# Patient Record
Sex: Female | Born: 2000 | Hispanic: Yes | Marital: Single | Smoking: Current every day smoker
Health system: Southern US, Community
[De-identification: ages and names within clinical notes are randomized; demographics above are authoritative.]

---
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
## 2023-09-17 ENCOUNTER — Other Ambulatory Visit: Payer: Self-pay

## 2023-09-17 DIAGNOSIS — K353 Acute appendicitis with localized peritonitis, without perforation or gangrene: Secondary | ICD-10-CM | POA: Diagnosis not present

## 2023-09-17 DIAGNOSIS — R112 Nausea with vomiting, unspecified: Secondary | ICD-10-CM | POA: Insufficient documentation

## 2023-09-17 DIAGNOSIS — R1031 Right lower quadrant pain: Secondary | ICD-10-CM | POA: Diagnosis present

## 2023-09-17 LAB — CBC
HCT: 39 % (ref 36.0–46.0)
Hemoglobin: 13.1 g/dL (ref 12.0–15.0)
MCH: 29 pg (ref 26.0–34.0)
MCHC: 33.6 g/dL (ref 30.0–36.0)
MCV: 86.5 fL (ref 80.0–100.0)
Platelets: 234 K/uL (ref 150–400)
RBC: 4.51 MIL/uL (ref 3.87–5.11)
RDW: 13 % (ref 11.5–15.5)
WBC: 16.4 K/uL — ABNORMAL HIGH (ref 4.0–10.5)
nRBC: 0 % (ref 0.0–0.2)

## 2023-09-17 LAB — POC URINE PREG, ED: Preg Test, Ur: NEGATIVE

## 2023-09-17 MED ORDER — ONDANSETRON 4 MG PO TBDP
ORAL_TABLET | ORAL | Status: AC
  Filled 2023-09-17: qty 1

## 2023-09-17 MED ORDER — ONDANSETRON 4 MG PO TBDP
4.0000 mg | ORAL_TABLET | Freq: Once | ORAL | Status: AC | PRN
  Administered 2023-09-17: 4 mg via ORAL

## 2023-09-17 NOTE — ED Triage Notes (Signed)
 Pt presented to ED with c/o abd pain and vomiting x 4 hours. States 5 episodes of vomiting. States started period last night and though pain was due to cramps. States took Tylenol  PTA without relief of symptoms. Denies urinary symptoms.

## 2023-09-18 ENCOUNTER — Observation Stay: Payer: Self-pay | Admitting: Anesthesiology

## 2023-09-18 ENCOUNTER — Emergency Department: Payer: Self-pay

## 2023-09-18 ENCOUNTER — Other Ambulatory Visit: Payer: Self-pay

## 2023-09-18 ENCOUNTER — Encounter
Admission: EM | Disposition: A | Discharge status: Home / Self Care | Payer: Self-pay | Source: Home / Self Care | Attending: Emergency Medicine

## 2023-09-18 ENCOUNTER — Observation Stay
Admission: EM | Admit: 2023-09-18 | Discharge: 2023-09-19 | Disposition: A | Discharge status: Home / Self Care | Payer: Self-pay | Attending: Surgery | Admitting: Surgery

## 2023-09-18 DIAGNOSIS — R112 Nausea with vomiting, unspecified: Secondary | ICD-10-CM

## 2023-09-18 DIAGNOSIS — R1033 Periumbilical pain: Principal | ICD-10-CM

## 2023-09-18 DIAGNOSIS — K3580 Unspecified acute appendicitis: Secondary | ICD-10-CM | POA: Diagnosis present

## 2023-09-18 DIAGNOSIS — K353 Acute appendicitis with localized peritonitis, without perforation or gangrene: Secondary | ICD-10-CM

## 2023-09-18 HISTORY — PX: XI ROBOTIC LAPAROSCOPIC ASSISTED APPENDECTOMY: SHX6877

## 2023-09-18 LAB — CBC
HCT: 35.1 % — ABNORMAL LOW (ref 36.0–46.0)
Hemoglobin: 12.1 g/dL (ref 12.0–15.0)
MCH: 29.5 pg (ref 26.0–34.0)
MCHC: 34.5 g/dL (ref 30.0–36.0)
MCV: 85.6 fL (ref 80.0–100.0)
Platelets: 199 K/uL (ref 150–400)
RBC: 4.1 MIL/uL (ref 3.87–5.11)
RDW: 13.1 % (ref 11.5–15.5)
WBC: 15 K/uL — ABNORMAL HIGH (ref 4.0–10.5)
nRBC: 0 % (ref 0.0–0.2)

## 2023-09-18 LAB — COMPREHENSIVE METABOLIC PANEL WITH GFR
ALT: 19 U/L (ref 0–44)
AST: 24 U/L (ref 15–41)
Albumin: 4.2 g/dL (ref 3.5–5.0)
Alkaline Phosphatase: 66 U/L (ref 38–126)
Anion gap: 10 (ref 5–15)
BUN: 13 mg/dL (ref 6–20)
CO2: 23 mmol/L (ref 22–32)
Calcium: 9.1 mg/dL (ref 8.9–10.3)
Chloride: 106 mmol/L (ref 98–111)
Creatinine, Ser: 0.74 mg/dL (ref 0.44–1.00)
GFR, Estimated: >60 mL/min (ref >60)
Glucose, Bld: 131 mg/dL — ABNORMAL HIGH (ref 70–99)
Potassium: 3.1 mmol/L — ABNORMAL LOW (ref 3.5–5.1)
Sodium: 139 mmol/L (ref 135–145)
Total Bilirubin: 0.8 mg/dL (ref 0.0–1.2)
Total Protein: 7.4 g/dL (ref 6.5–8.1)

## 2023-09-18 LAB — URINALYSIS, ROUTINE W REFLEX MICROSCOPIC
Bilirubin Urine: NEGATIVE
Glucose, UA: NEGATIVE mg/dL
Ketones, ur: NEGATIVE mg/dL
Leukocytes,Ua: NEGATIVE
Nitrite: NEGATIVE
Protein, ur: 30 mg/dL — AB
RBC / HPF: >50 RBC/hpf (ref 0–5)
Specific Gravity, Urine: 1.021 (ref 1.005–1.030)
pH: 5 (ref 5.0–8.0)

## 2023-09-18 LAB — LIPASE, BLOOD: Lipase: 36 U/L (ref 11–51)

## 2023-09-18 LAB — BASIC METABOLIC PANEL WITH GFR
Anion gap: 8 (ref 5–15)
BUN: 10 mg/dL (ref 6–20)
CO2: 24 mmol/L (ref 22–32)
Calcium: 8.5 mg/dL — ABNORMAL LOW (ref 8.9–10.3)
Chloride: 106 mmol/L (ref 98–111)
Creatinine, Ser: 0.66 mg/dL (ref 0.44–1.00)
GFR, Estimated: >60 mL/min (ref >60)
Glucose, Bld: 105 mg/dL — ABNORMAL HIGH (ref 70–99)
Potassium: 3.6 mmol/L (ref 3.5–5.1)
Sodium: 138 mmol/L (ref 135–145)

## 2023-09-18 LAB — HIV ANTIBODY (ROUTINE TESTING W REFLEX): HIV Screen 4th Generation wRfx: NONREACTIVE

## 2023-09-18 SURGERY — APPENDECTOMY, ROBOT-ASSISTED, LAPAROSCOPIC
Anesthesia: General

## 2023-09-18 MED ORDER — LIDOCAINE-EPINEPHRINE (PF) 1 %-1:200000 IJ SOLN
INTRAMUSCULAR | Status: AC
  Filled 2023-09-18: qty 30

## 2023-09-18 MED ORDER — SUGAMMADEX SODIUM 200 MG/2ML IV SOLN
INTRAVENOUS | Status: DC | PRN
  Administered 2023-09-18: 200 mg via INTRAVENOUS

## 2023-09-18 MED ORDER — ACETAMINOPHEN 325 MG PO TABS: 650.0000 mg | ORAL_TABLET | Freq: Three times a day (TID) | ORAL | Status: DC | PRN

## 2023-09-18 MED ORDER — LIDOCAINE HCL (PF) 2 % IJ SOLN
INTRAMUSCULAR | Status: AC
  Filled 2023-09-18: qty 5

## 2023-09-18 MED ORDER — LACTATED RINGERS IV SOLN: INTRAVENOUS | Status: DC

## 2023-09-18 MED ORDER — DOCUSATE SODIUM 100 MG PO CAPS: 100.0000 mg | ORAL_CAPSULE | Freq: Two times a day (BID) | ORAL | 0 refills | Status: AC | PRN

## 2023-09-18 MED ORDER — LIDOCAINE-EPINEPHRINE (PF) 1 %-1:200000 IJ SOLN
INTRAMUSCULAR | Status: DC | PRN
  Administered 2023-09-18: 30 mL

## 2023-09-18 MED ORDER — BUPIVACAINE HCL (PF) 0.5 % IJ SOLN
INTRAMUSCULAR | Status: AC
  Filled 2023-09-18: qty 30

## 2023-09-18 MED ORDER — MORPHINE SULFATE (PF) 2 MG/ML IV SOLN
2.0000 mg | INTRAVENOUS | Status: DC | PRN
  Administered 2023-09-18 – 2023-09-19 (×4): 2 mg via INTRAVENOUS
  Filled 2023-09-18 (×4): qty 1

## 2023-09-18 MED ORDER — PROPOFOL 10 MG/ML IV BOLUS
INTRAVENOUS | Status: AC
  Filled 2023-09-18: qty 20

## 2023-09-18 MED ORDER — LACTATED RINGERS IV BOLUS
500.0000 mL | Freq: Once | INTRAVENOUS | Status: AC
  Administered 2023-09-18: 500 mL via INTRAVENOUS

## 2023-09-18 MED ORDER — FENTANYL CITRATE (PF) 100 MCG/2ML IJ SOLN: 25.0000 ug | INTRAMUSCULAR | Status: DC | PRN

## 2023-09-18 MED ORDER — IBUPROFEN 800 MG PO TABS: 800.0000 mg | ORAL_TABLET | Freq: Three times a day (TID) | ORAL | 0 refills | Status: AC | PRN

## 2023-09-18 MED ORDER — DEXAMETHASONE SODIUM PHOSPHATE 10 MG/ML IJ SOLN
INTRAMUSCULAR | Status: DC | PRN
Start: 2023-09-18 — End: 2023-09-18
  Administered 2023-09-18: 10 mg via INTRAVENOUS

## 2023-09-18 MED ORDER — LACTATED RINGERS IV SOLN: INTRAVENOUS | Status: DC | PRN

## 2023-09-18 MED ORDER — MORPHINE SULFATE (PF) 2 MG/ML IV SOLN
2.0000 mg | Freq: Once | INTRAVENOUS | Status: AC
  Administered 2023-09-18: 2 mg via INTRAVENOUS
  Filled 2023-09-18: qty 1

## 2023-09-18 MED ORDER — LIDOCAINE HCL (CARDIAC) PF 100 MG/5ML IV SOSY
PREFILLED_SYRINGE | INTRAVENOUS | Status: DC | PRN
  Administered 2023-09-18: 60 mg via INTRAVENOUS

## 2023-09-18 MED ORDER — ACETAMINOPHEN 325 MG PO TABS: 650.0000 mg | ORAL_TABLET | Freq: Three times a day (TID) | ORAL | 0 refills | Status: AC | PRN

## 2023-09-18 MED ORDER — SODIUM CHLORIDE 0.9 % IV SOLN
2.0000 g | Freq: Two times a day (BID) | INTRAVENOUS | Status: DC
  Administered 2023-09-18: 2 g via INTRAVENOUS
  Filled 2023-09-18: qty 2

## 2023-09-18 MED ORDER — ROCURONIUM BROMIDE 100 MG/10ML IV SOLN
INTRAVENOUS | Status: DC | PRN
  Administered 2023-09-18: 50 mg via INTRAVENOUS

## 2023-09-18 MED ORDER — 0.9 % SODIUM CHLORIDE (POUR BTL) OPTIME
TOPICAL | Status: DC | PRN
  Administered 2023-09-18: 500 mL

## 2023-09-18 MED ORDER — HYDROCODONE-ACETAMINOPHEN 5-325 MG PO TABS: 1.0000 | ORAL_TABLET | Freq: Three times a day (TID) | ORAL | Status: DC | PRN

## 2023-09-18 MED ORDER — ONDANSETRON HCL 4 MG/2ML IJ SOLN
4.0000 mg | Freq: Four times a day (QID) | INTRAMUSCULAR | Status: DC | PRN
  Administered 2023-09-18: 4 mg via INTRAVENOUS
  Filled 2023-09-18: qty 2

## 2023-09-18 MED ORDER — MIDAZOLAM HCL 2 MG/2ML IJ SOLN
INTRAMUSCULAR | Status: AC
  Filled 2023-09-18: qty 2

## 2023-09-18 MED ORDER — ACETAMINOPHEN 10 MG/ML IV SOLN: 1000.0000 mg | Freq: Once | INTRAVENOUS | Status: DC | PRN

## 2023-09-18 MED ORDER — IOHEXOL 300 MG/ML  SOLN
100.0000 mL | Freq: Once | INTRAMUSCULAR | Status: AC | PRN
  Administered 2023-09-18: 100 mL via INTRAVENOUS

## 2023-09-18 MED ORDER — METRONIDAZOLE 500 MG/100ML IV SOLN
500.0000 mg | Freq: Two times a day (BID) | INTRAVENOUS | Status: DC
  Administered 2023-09-18 – 2023-09-19 (×3): 500 mg via INTRAVENOUS
  Filled 2023-09-18 (×3): qty 100

## 2023-09-18 MED ORDER — PROPOFOL 10 MG/ML IV BOLUS
INTRAVENOUS | Status: DC | PRN
  Administered 2023-09-18: 160 mg via INTRAVENOUS

## 2023-09-18 MED ORDER — SODIUM CHLORIDE 0.9 % IV SOLN
2.0000 g | INTRAVENOUS | Status: DC
  Administered 2023-09-18 – 2023-09-19 (×2): 2 g via INTRAVENOUS
  Filled 2023-09-18 (×2): qty 20

## 2023-09-18 MED ORDER — ONDANSETRON HCL 4 MG/2ML IJ SOLN
INTRAMUSCULAR | Status: DC | PRN
  Administered 2023-09-18: 4 mg via INTRAVENOUS

## 2023-09-18 MED ORDER — TRAMADOL HCL 50 MG PO TABS
50.0000 mg | ORAL_TABLET | Freq: Three times a day (TID) | ORAL | 0 refills | Status: AC | PRN
Start: 2023-09-18 — End: 2024-09-17

## 2023-09-18 MED ORDER — TRAMADOL HCL 50 MG PO TABS: 50.0000 mg | ORAL_TABLET | Freq: Four times a day (QID) | ORAL | Status: DC | PRN

## 2023-09-18 MED ORDER — DOCUSATE SODIUM 100 MG PO CAPS: 100.0000 mg | ORAL_CAPSULE | Freq: Two times a day (BID) | ORAL | Status: DC | PRN

## 2023-09-18 MED ORDER — FENTANYL CITRATE (PF) 100 MCG/2ML IJ SOLN
INTRAMUSCULAR | Status: DC | PRN
  Administered 2023-09-18 (×2): 50 ug via INTRAVENOUS

## 2023-09-18 MED ORDER — ONDANSETRON 4 MG PO TBDP: 4.0000 mg | ORAL_TABLET | Freq: Four times a day (QID) | ORAL | Status: DC | PRN

## 2023-09-18 MED ORDER — FENTANYL CITRATE (PF) 100 MCG/2ML IJ SOLN
INTRAMUSCULAR | Status: AC
Start: 2023-09-18 — End: 2023-09-18
  Filled 2023-09-18: qty 2

## 2023-09-18 MED ORDER — MIDAZOLAM HCL 2 MG/2ML IJ SOLN
INTRAMUSCULAR | Status: DC | PRN
  Administered 2023-09-18: 2 mg via INTRAVENOUS

## 2023-09-18 SURGICAL SUPPLY — 32 items
ANCHOR TIS RET SYS 235ML (MISCELLANEOUS) ×1 IMPLANT
COVER TIP SHEARS 8 DVNC (MISCELLANEOUS) ×1 IMPLANT
COVER WAND RF STERILE (DRAPES) ×1 IMPLANT
DEFOGGER SCOPE WARM SEASHARP (MISCELLANEOUS) ×1 IMPLANT
DERMABOND ADVANCED .7 DNX12 (GAUZE/BANDAGES/DRESSINGS) ×1 IMPLANT
DRAPE ARM DVNC X/XI (DISPOSABLE) ×3 IMPLANT
DRAPE COLUMN DVNC XI (DISPOSABLE) ×1 IMPLANT
ELECTRODE REM PT RTRN 9FT ADLT (ELECTROSURGICAL) ×1 IMPLANT
FORCEPS BPLR FENES DVNC XI (FORCEP) ×1 IMPLANT
GLOVE BIOGEL PI IND STRL 7.0 (GLOVE) ×2 IMPLANT
GLOVE SURG SYN 6.5 PF PI (GLOVE) ×4 IMPLANT
GOWN STRL REUS W/ TWL LRG LVL3 (GOWN DISPOSABLE) ×4 IMPLANT
GRASPER SUT TROCAR 14GX15 (MISCELLANEOUS) IMPLANT
KIT TURNOVER KIT A (KITS) ×1 IMPLANT
NDL HYPO 22X1.5 SAFETY MO (MISCELLANEOUS) ×1 IMPLANT
NDL INSUFFLATION 14GA 120MM (NEEDLE) ×1 IMPLANT
NEEDLE HYPO 22X1.5 SAFETY MO (MISCELLANEOUS) ×1 IMPLANT
NEEDLE INSUFFLATION 14GA 120MM (NEEDLE) ×1 IMPLANT
OBTURATOR OPTICALSTD 8 DVNC (TROCAR) ×1 IMPLANT
PACK LAP CHOLECYSTECTOMY (MISCELLANEOUS) ×1 IMPLANT
RELOAD STAPLE 45 2.5 WHT DVNC (STAPLE) IMPLANT
RELOAD STAPLE 45 3.5 BLU DVNC (STAPLE) IMPLANT
SCISSORS MNPLR CVD DVNC XI (INSTRUMENTS) ×1 IMPLANT
SEAL UNIV 5-12 XI (MISCELLANEOUS) ×3 IMPLANT
SET TUBE SMOKE EVAC HIGH FLOW (TUBING) ×1 IMPLANT
SOLUTION ELECTROSURG ANTI STCK (MISCELLANEOUS) ×1 IMPLANT
STAPLER 45 SUREFORM DVNC (STAPLE) IMPLANT
SUT MNCRL AB 4-0 PS2 18 (SUTURE) ×1 IMPLANT
SUT VICRYL 0 UR6 27IN ABS (SUTURE) ×1 IMPLANT
SYR 30ML LL (SYRINGE) ×1 IMPLANT
TRAP FLUID SMOKE EVACUATOR (MISCELLANEOUS) ×1 IMPLANT
WATER STERILE IRR 500ML POUR (IV SOLUTION) ×1 IMPLANT

## 2023-09-18 NOTE — H&P (Signed)
 Subjective:   CC: Acute appendicitis  HPI:  Kristin Acosta is a 23 y.o. female who is consulted by Jossie for evaluation of  above cc.  Symptoms were first noted several hours ago. Pain is sharp, periumbilical associated with vomiting, exacerbated by nothing specific.  Recent travel to [place] and started her period last night.  However, due to the persistent nature of the pain and vomiting, presented to the ED.     Past Medical History:  has no past medical history on file.  Past Surgical History:  has no past surgical history on file.  Family History: family history is not on file.  Social History:  reports that she has been smoking cigarettes and e-cigarettes. She has never used smokeless tobacco. She reports that she does not drink alcohol and does not use drugs.  Current Medications:  Prior to Admission medications   Medication Sig Start Date End Date Taking? Authorizing Provider  cetirizine  (ZYRTEC ) 10 MG tablet Take 1 tablet (10 mg total) by mouth daily. 09/17/15   Hagler, Jami L, PA-C  diphenhydrAMINE  (BENADRYL ) 25 mg capsule Take 1 capsule (25 mg total) by mouth every 6 (six) hours as needed for itching. 09/17/15 09/16/16  Hagler, Jami L, PA-C    Allergies:  Allergies as of 09/17/2023 - Review Complete 09/17/2023  Allergen Reaction Noted   Penicillins Rash 09/17/2015    ROS:  Pertinent negative and positives noted in HPI   Objective:     BP 106/68 (BP Location: Right Arm)   Pulse 67   Temp 97.9 F (36.6 C) (Oral)   Resp 17   Ht 5' 3 (1.6 m)   Wt 56.7 kg   LMP 09/16/2023 (Exact Date)   SpO2 100%   BMI 22.14 kg/m    Constitutional :  alert, cooperative, appears stated age, and no distress  Respiratory:  Clear to auscultation bilaterally  Cardiovascular:  Regular rate and rhythm  Gastrointestinal: Soft, no guarding, mild tenderness to palpation periumbilical area.   Skin: Cool and moist  Psychiatric: Normal affect, non-agitated, not confused       LABS:      Latest Ref Rng & Units 09/17/2023   11:21 PM 12/12/2012    8:15 AM  CMP  Glucose 70 - 99 mg/dL 868  87   BUN 6 - 20 mg/dL 13    Creatinine 9.55 - 1.00 mg/dL 9.25    Sodium 864 - 854 mmol/L 139    Potassium 3.5 - 5.1 mmol/L 3.1    Chloride 98 - 111 mmol/L 106    CO2 22 - 32 mmol/L 23    Calcium 8.9 - 10.3 mg/dL 9.1    Total Protein 6.5 - 8.1 g/dL 7.4    Total Bilirubin 0.0 - 1.2 mg/dL 0.8    Alkaline Phos 38 - 126 U/L 66    AST 15 - 41 U/L 24    ALT 0 - 44 U/L 19        Latest Ref Rng & Units 09/17/2023   11:21 PM  CBC  WBC 4.0 - 10.5 K/uL 16.4   Hemoglobin 12.0 - 15.0 g/dL 86.8   Hematocrit 63.9 - 46.0 % 39.0   Platelets 150 - 400 K/uL 234      RADS: CLINICAL DATA:  Right lower quadrant pain   EXAM: CT ABDOMEN AND PELVIS WITH CONTRAST   TECHNIQUE: Multidetector CT imaging of the abdomen and pelvis was performed using the standard protocol following bolus administration of intravenous contrast.  RADIATION DOSE REDUCTION: This exam was performed according to the departmental dose-optimization program which includes automated exposure control, adjustment of the mA and/or kV according to patient size and/or use of iterative reconstruction technique.   CONTRAST:  OMNIPAQUE  IOHEXOL  300 MG/ML  SOLN   COMPARISON:  None Available.   FINDINGS: Lower chest: No acute abnormality.   Hepatobiliary: No focal liver abnormality is seen. No gallstones, gallbladder wall thickening, or biliary dilatation.   Pancreas: Unremarkable. No pancreatic ductal dilatation or surrounding inflammatory changes.   Spleen: Normal in size without focal abnormality.   Adrenals/Urinary Tract: Adrenal glands are unremarkable. Kidneys are normal, without renal calculi, focal lesion, or hydronephrosis. Bladder is unremarkable.   Stomach/Bowel: Stomach is within normal limits. No evidence of bowel wall thickening, distention, or inflammatory changes.   The appendix appears  slightly enlarged measuring 9 mm. Some gas within the tip. Mild mucosal enhancement, some wall thickening at the base of the cecum near appendix origin.   Vascular/Lymphatic: No significant vascular findings are present. No enlarged abdominal or pelvic lymph nodes.   Reproductive: Uterus and bilateral adnexa are unremarkable.   Other: Negative for pelvic effusion or free air.   Musculoskeletal: No acute or significant osseous findings.   IMPRESSION: 1. Borderline appearance of appendix which appears slightly enlarged, mostly fluid-filled and and demonstrates mild mucosal enhancement, but equivocal periappendiceal inflammatory change. Findings are indeterminate for appendicitis.     Electronically Signed   By: Luke Bun M.D.   On: 09/18/2023 01:33 Assessment:      Acute appendicitis-early equivocal findings on CT with leukocytosis.  Current menstrual cycle may be contributing to some of the pain but offered surgery for confirmation but it is not appendicitis.  Patient is agreeable to proceed.  Plan:      Discussed the risk of surgery including post-op infxn, seroma, hematoma, abscess formation, chronic pain, poor-delayed wound healing, possible bowel resection, possible ostomy, possible conversion to open procedure, post-op SBO or ileus, and need for additional procedures to address said risks.  The risks of general anesthetic including MI, CVA, sudden death or even reaction to anesthetic medications also discussed. Alternatives include continued observation, or antibiotic treatment.  Benefits include possible symptom relief,   Typical post operative recovery of 3-5 days rest, also discussed.  The patient understands the risks, any and all questions were answered to the patient's satisfaction.  N.p.o., IV fluids, IV antibiotic until robotic assisted laparoscopic appendectomy.  labs/images/medications/previous chart entries reviewed personally and relevant changes/updates  noted above.

## 2023-09-18 NOTE — ED Notes (Signed)
 Patient transported to CT

## 2023-09-18 NOTE — Discharge Instructions (Signed)
 Laparoscopic Appendectomy, Care After This sheet gives you information about how to care for yourself after your procedure. Your doctor may also give you more specific instructions. If you have problems or questions, contact your doctor. Follow these instructions at home: Care for cuts from surgery (incisions)  Follow instructions from your doctor about how to take care of your cuts from surgery. Make sure you: Wash your hands with soap and water before you change your bandage (dressing). If you cannot use soap and water, use hand sanitizer. Change your bandage as told by your doctor. Leave stitches (sutures), skin glue, or skin tape (adhesive) strips in place. They may need to stay in place for 2 weeks or longer. If tape strips get loose and curl up, you may trim the loose edges. Do not remove tape strips completely unless your doctor says it is okay. Do not take baths, swim, or use a hot tub until your doctor says it is okay. OK TO SHOWER 24HRS AFTER YOUR SURGERY.  Check your surgical cut area every day for signs of infection. Check for: More redness, swelling, or pain. More fluid or blood. Warmth. Pus or a bad smell. Activity Do not drive or use heavy machinery while taking prescription pain medicine. Do not play contact sports until your doctor says it is okay. Do not drive for 24 hours if you were given a medicine to help you relax (sedative). Rest as needed. Do not return to work or school until your doctor says it is okay. General instructions  tylenol  and advil  as needed for discomfort.  Please alternate between the two every four hours as needed for pain.    Use narcotics, if prescribed, only when tylenol  and motrin  is not enough to control pain.  325-650mg  every 8hrs to max of 3000mg /24hrs (including the 325mg  in every norco dose) for the tylenol .    Advil  up to 800mg  per dose every 8hrs as needed for pain.   To prevent or treat constipation while you are taking prescription pain  medicine, your doctor may recommend that you: Drink enough fluid to keep your pee (urine) clear or pale yellow. Take over-the-counter or prescription medicines. Eat foods that are high in fiber, such as fresh fruits and vegetables, whole grains, and beans. Limit foods that are high in fat and processed sugars, such as fried and sweet foods. Contact a doctor if: You develop a rash. You have more redness, swelling, or pain around your surgical cuts. You have more fluid or blood coming from your surgical cuts. Your surgical cuts feel warm to the touch. You have pus or a bad smell coming from your surgical cuts. You have a fever. One or more of your surgical cuts breaks open. Get help right away if: You have trouble breathing. You have chest pain. You faint or feel dizzy when you stand. You have leg pain. This information is not intended to replace advice given to you by your health care provider. Make sure you discuss any questions you have with your health care provider. Document Released: 10/28/2007 Document Revised: 08/09/2015 Document Reviewed: 07/07/2015 Elsevier Interactive Patient Education  2019 [employer].  Gua de Recursos Comunitarios - Asistencia Financiera El [address] es una excelente fuente de informacin sobre los servicios comunitarios disponibles. Acceda marcando el 211 desde cualquier lugar de [place] o a travs del sitio web: PooledIncome.pl.  Otros recursos locales (Actualizado 12/2015)  Asistencia financiera  Servicios  Telfono y direccin [facility] ?  Atencin mdica de bajo costo - 1.er y 3.er sbado de cada mes ? No se requiere seguro pblico ni privado y se requiere ingresos limitados [phone] [address] [place], [place]  Departamento de Servicios Sociales del [place] de [place] ? Cuidado infantil ? Asistencia de emergencia para vivienda y servicios pblicos ? Cupones de alimentos ? Medicaid  [phone] [address] [place], [place] [address]  Departamento de Salud del [place] de [place] ? Atencin mdica de bajo costo para nios, enfermedades transmisibles, enfermedades de transmisin sexual, vacunas, atencin de maternidad, salud femenina y planificacin familiar [phone] [address] [place], [place] [address] [employer] de [employer] de Medicamentos del [facility] [place] ? Asistencia con medicamentos para residentes del [place] ? Debe cumplir con los requisitos de ingresos [phone] [address] [place], [place]. Servicios Sociales del [place] de [place] ? Cuidado infantil ? Asistencia de emergencia para vivienda y servicios pblicos ? Cupones de alimentos ? Medicaid [phone] [address] [place], [place] [address] [facility] [place] [facility] [place] ? Atencin mdica de bajo costo ? Lunes a viernes, de 9 a. m. a 6 p. m. Acepta Medicare/Medicaid y pago por cuenta propia [phone] [address] [place], [place] [address] [facility] ? Atencin mdica de bajo costo: de lunes a viernes, de 8:30 a. m. a 5:30 p. m. ? Acepta Medicaid y pago por cuenta propia [phone] [address], [address], [place], [place] [address] [facility] ? Atencin mdica primaria, incluyendo para personas con anemia de clulas falciformes ? Acepta Medicare, Medicaid, seguros y pago por cuenta propia [phone] [address] [place], [place] [facility] ? Atencin mdica primaria ? Acepta Medicare, Medicaid, seguros y pago por cuenta propia [phone] [address] [place], [place], [place] [address]  Departamento de Servicios Sociales del [place] de [place] ? Cuidado infantil ? Asistencia de emergencia para vivienda y servicios pblicos ? Cupones de alimentos ? Medicaid [phone] [place], [place]  [address] Departamento de Salud y [place] del [place] de [place] ? Cuidado infantil ? Asistencia de emergencia para vivienda y servicios pblicos ? Cupones de alimentos ? Medicaid [phone] [address] [place], [place] [address]  Programa de Asistencia con Medicamentos del [place] de [place] ? Asistencia con medicamentos solo para residentes del [place] de [place] sin seguro mdico ? Debe tener un mdico de cabecera [phone] [address] [place] [place], [place] [facility] ? Atencin mdica primaria ? Acepta Medicare, Medicaid y seguros mdicos [phone] [address] [place], [place] [facility] ? Atencin mdica primaria, de lunes a viernes, de 8:00 a. m. a 5:00 p. m. ? Acepta Medicaid, Medicare y seguros mdicos ? Earlis atencin a personas sin importar su capacidad de pago [phone] [address] [place], [place] [facility] ? Myna con medicamentos [phone] [facility] ? Atencin mdica primaria ? Acepta Medicare, Medicaid, seguros mdicos y pago por cuenta propia [phone] [address] [place], [place] [address] [facility] Atencin mdica primaria Se acepta Medicare, Medicaid, seguros mdicos y pago por cuenta propia. [phone] [address], [place], [place] [address] [facility] [place] ? Para residentes del [address] de  de [place] 18 y 40 aos que no cuentan con seguro mdico, [employer], Medicaid ni beneficios del Departamento de [place] Los servicios son gratuitos para pacientes sin seguro mdico que  se encuentran dentro de los lmites federales de pobreza.  Horario: [place], de 4:15 p. m. a 8 p. m. [phone] [address] [place], [place] [address] Servicios de [facility]  Atencin mdica primaria Se acepta Medicaid, Medicare y la mayora de los seguros mdicos.  Las tarifas se  ajustan segn la capacidad de pago. [phone] [facility] [place] [address], [place], [place]  [phone] [facility] [place] [address] [place] [place], [place]  [phone] [facility] de [place], [place]  [phone] Lowella Hamilton, [address] [place], [place]  [phone] [facility] de [place] [address] [place], [place] [facility] ? Salud femenina y planificacin familiar [phone] [place], [place] Departamento de [place] del [place] ? Cuidado infantil ? Asistencia de emergencia para vivienda y servicios pblicos ? [employer] [place] ? Medicaid [phone] [address], [place], [place] [address]  [facility] ? Mayores de 18 aos ? Horario: Lunes y [employer], de 7:00 a. m. a 9:00 a. m. Atendemos por orden de llegada. [phone], [phone] [place], [place]

## 2023-09-18 NOTE — ED Notes (Signed)
Surgery consult at patient bedside

## 2023-09-18 NOTE — ED Provider Notes (Signed)
 [facility] Provider Note   Event Date/Time   First MD Initiated Contact with Patient 09/18/23 0038     (approximate) History  Abdominal Pain  HPI Kristin Acosta is a 23 y.o. female with no stated past medical history who presents complaining of periumbilical abdominal pain and nausea/vomiting that is present over the last 4 hours.  Patient states that she has been p.o. intolerance since that time.  Patient states that she started her period last night and believes that this was just cramping at first however she took Tylenol  prior to arrival with no relief of her symptoms.  Patient does endorse recent travel to [place] approximately 1 week ago and has been having nasal congestion since returning. ROS: Patient currently denies any vision changes, tinnitus, difficulty speaking, facial droop, sore throat, chest pain, shortness of breath, diarrhea, dysuria, or weakness/numbness/paresthesias in any extremity   Physical Exam  Triage Vital Signs: ED Triage Vitals [09/17/23 2315]  Encounter Vitals Group     BP 113/68     Girls Systolic BP Percentile      Girls Diastolic BP Percentile      Boys Systolic BP Percentile      Boys Diastolic BP Percentile      Pulse Rate 71     Resp 18     Temp 97.9 F (36.6 C)     Temp Source Oral     SpO2 100 %     Weight 125 lb (56.7 kg)     Height 5' 3 (1.6 m)     Head Circumference      Peak Flow      Pain Score 8     Pain Loc      Pain Education      Exclude from Growth Chart    Most recent vital signs: Vitals:   09/17/23 2315 09/18/23 0155  BP: 113/68 106/68  Pulse: 71 67  Resp: 18 17  Temp: 97.9 F (36.6 C)   SpO2: 100% 100%   General: Awake, oriented x4. CV:  Good peripheral perfusion. Resp:  Normal effort. Abd:  No distention.  Periumbilical tenderness to palpation Other:  Young adult well-developed, well-nourished Hispanic female resting comfortably in no acute distress ED Results / Procedures / Treatments   Labs (all labs ordered are listed, but only abnormal results are displayed) Labs Reviewed  COMPREHENSIVE METABOLIC PANEL WITH GFR - Abnormal; Notable for the following components:      Result Value   Potassium 3.1 (*)    Glucose, Bld 131 (*)    All other components within normal limits  CBC - Abnormal; Notable for the following components:   WBC 16.4 (*)    All other components within normal limits  URINALYSIS, ROUTINE W REFLEX MICROSCOPIC - Abnormal; Notable for the following components:   Color, Urine YELLOW (*)    APPearance HAZY (*)    Hgb urine dipstick LARGE (*)    Protein, ur 30 (*)    Bacteria, UA RARE (*)    All other components within normal limits  LIPASE, BLOOD  POC URINE PREG, ED   RADIOLOGY ED MD interpretation: CT of the abdomen pelvis with IV contrast shows borderline appearance of the appendix which appears slightly enlarged, mostly fluid-filled, demonstrates mild mucosal enhancement concerning for appendicitis - All radiology independently interpreted and agree with radiology assessment Official radiology report(s): CT ABDOMEN PELVIS W CONTRAST Result Date: 09/18/2023 CLINICAL DATA:  Right lower quadrant pain EXAM: CT ABDOMEN AND PELVIS  WITH CONTRAST TECHNIQUE: Multidetector CT imaging of the abdomen and pelvis was performed using the standard protocol following bolus administration of intravenous contrast. RADIATION DOSE REDUCTION: This exam was performed according to the departmental dose-optimization program which includes automated exposure control, adjustment of the mA and/or kV according to patient size and/or use of iterative reconstruction technique. CONTRAST:  OMNIPAQUE  IOHEXOL  300 MG/ML  SOLN COMPARISON:  None Available. FINDINGS: Lower chest: No acute abnormality. Hepatobiliary: No focal liver abnormality is seen. No gallstones, gallbladder wall thickening, or biliary dilatation. Pancreas: Unremarkable. No pancreatic ductal dilatation or surrounding  inflammatory changes. Spleen: Normal in size without focal abnormality. Adrenals/Urinary Tract: Adrenal glands are unremarkable. Kidneys are normal, without renal calculi, focal lesion, or hydronephrosis. Bladder is unremarkable. Stomach/Bowel: Stomach is within normal limits. No evidence of bowel wall thickening, distention, or inflammatory changes. The appendix appears slightly enlarged measuring 9 mm. Some gas within the tip. Mild mucosal enhancement, some wall thickening at the base of the cecum near appendix origin. Vascular/Lymphatic: No significant vascular findings are present. No enlarged abdominal or pelvic lymph nodes. Reproductive: Uterus and bilateral adnexa are unremarkable. Other: Negative for pelvic effusion or free air. Musculoskeletal: No acute or significant osseous findings. IMPRESSION: 1. Borderline appearance of appendix which appears slightly enlarged, mostly fluid-filled and and demonstrates mild mucosal enhancement, but equivocal periappendiceal inflammatory change. Findings are indeterminate for appendicitis. Electronically Signed   By: Luke Bun M.D.   On: 09/18/2023 01:33   PROCEDURES: Critical Care performed: Yes, see critical care procedure note(s) Procedures CRITICAL CARE Performed by: Sebastian Dzik K Domenic Schoenberger  Total critical care time: 33 minutes  Critical care time was exclusive of separately billable procedures and treating other patients.  Critical care was necessary to treat or prevent imminent or life-threatening deterioration.  Critical care was time spent personally by me on the following activities: development of treatment plan with patient and/or surrogate as well as nursing, discussions with consultants, evaluation of patient's response to treatment, examination of patient, obtaining history from patient or surrogate, ordering and performing treatments and interventions, ordering and review of laboratory studies, ordering and review of radiographic studies, pulse  oximetry and re-evaluation of patient's condition.  MEDICATIONS ORDERED IN ED: Medications  cefoTEtan  (CEFOTAN ) 2 g in sodium chloride  0.9 % 100 mL IVPB (has no administration in time range)  lactated ringers  infusion (has no administration in time range)  ondansetron  (ZOFRAN -ODT) disintegrating tablet 4 mg (4 mg Oral Given 09/17/23 2321)  morphine  (PF) 2 MG/ML injection 2 mg (2 mg Intravenous Given 09/18/23 0151)  iohexol  (OMNIPAQUE ) 300 MG/ML solution 100 mL (100 mLs Intravenous Contrast Given 09/18/23 0059)   IMPRESSION / MDM / ASSESSMENT AND PLAN / ED COURSE  I reviewed the triage vital signs and the nursing notes.                             The patient is on the cardiac monitor to evaluate for evidence of arrhythmia and/or significant heart rate changes. Patient's presentation is most consistent with acute presentation with potential threat to life or bodily function. 23 year old female presents with periumbilical abdominal pain c/f appendicitis.  Presentation not consistent with SBO, cholecystitis, torsion, strangulated hernia, or other emergent cause of abdominal pain. CT of the abdomen and pelvis shows acute uncomplicated appendicitis. Not demonstrating signs of perforation or peritonitis on initial exam or reassessment.  ED Antibiotics: Cefotetan   Rx: Zofran , Norco, Consult: I spoke with Dr. Tye in general surgery  who agrees with plan to take patient to the OR Disposition: Admit to surgery   FINAL CLINICAL IMPRESSION(S) / ED DIAGNOSES   Final diagnoses:  Periumbilical abdominal pain  Acute appendicitis with localized peritonitis, without perforation, abscess, or gangrene   Rx / DC Orders   ED Discharge Orders     None      Note:  This document was prepared using Dragon voice recognition software and may include unintentional dictation errors.   Linley Moxley K, MD 09/18/23 [phone]

## 2023-09-18 NOTE — Anesthesia Preprocedure Evaluation (Addendum)
 Anesthesia Evaluation  Patient identified by MRN, date of birth, ID band Patient awake    Reviewed: Allergy & Precautions, NPO status , Patient's Chart, lab work & pertinent test results  History of Anesthesia Complications Negative for: history of anesthetic complications  Airway Mallampati: I   Neck ROM: Full    Dental no notable dental hx.    Pulmonary  Current vaping   Pulmonary exam normal breath sounds clear to auscultation       Cardiovascular Exercise Tolerance: Good negative cardio ROS Normal cardiovascular exam Rhythm:Regular Rate:Normal     Neuro/Psych negative neurological ROS     GI/Hepatic negative GI ROS, Neg liver ROS,,,  Endo/Other  negative endocrine ROS    Renal/GU negative Renal ROS     Musculoskeletal   Abdominal   Peds  Hematology negative hematology ROS (+)   Anesthesia Other Findings   Reproductive/Obstetrics                              Anesthesia Physical Anesthesia Plan  ASA: 2 and emergent  Anesthesia Plan: General   Post-op Pain Management:    Induction: Intravenous  PONV Risk Score and Plan: 2 and Ondansetron , Dexamethasone  and Treatment may vary due to age or medical condition  Airway Management Planned: Oral ETT  Additional Equipment:   Intra-op Plan:   Post-operative Plan: Extubation in OR  Informed Consent: I have reviewed the patients History and Physical, chart, labs and discussed the procedure including the risks, benefits and alternatives for the proposed anesthesia with the patient or authorized representative who has indicated his/her understanding and acceptance.     Dental advisory given  Plan Discussed with: CRNA  Anesthesia Plan Comments: (Patient consented for risks of anesthesia including but not limited to:  - adverse reactions to medications - damage to eyes, teeth, lips or other oral mucosa - nerve damage due to  positioning  - sore throat or hoarseness - damage to heart, brain, nerves, lungs, other parts of body or loss of life  Informed patient about role of CRNA in peri- and intra-operative care.  Patient voiced understanding.)         Anesthesia Quick Evaluation

## 2023-09-18 NOTE — Anesthesia Postprocedure Evaluation (Signed)
 Anesthesia Post Note  Patient: Kristin Acosta  Procedure(s) Performed: APPENDECTOMY, ROBOT-ASSISTED, LAPAROSCOPIC  Patient location during evaluation: PACU Anesthesia Type: General Level of consciousness: awake and alert, oriented and patient cooperative Pain management: pain level controlled Vital Signs Assessment: post-procedure vital signs reviewed and stable Respiratory status: spontaneous breathing, nonlabored ventilation and respiratory function stable Cardiovascular status: blood pressure returned to baseline and stable Postop Assessment: adequate PO intake Anesthetic complications: no   No notable events documented.   Last Vitals:  Vitals:   09/18/23 1415 09/18/23 1445  BP: (!) 89/53 93/62  Pulse: 69 62  Resp: 13 16  Temp:  36.8 C  SpO2: 97% 100%    Last Pain:  Vitals:   09/18/23 1454  TempSrc:   PainSc: 8                  Shakim Faith

## 2023-09-18 NOTE — Op Note (Signed)
 Preoperative diagnosis: acute appendicitis  Postoperative diagnosis: Same  Procedure: Robotic assisted laparoscopic appendectomy.  Anesthesia: GETA  Surgeon: Henriette Pierre  Wound Classification: clean contaminated  Specimen: Appendix  Complications: None  Estimated Blood Loss: 3 mL   Indications: Patient is a 23 y.o. female  presented with above.  Please see H&P for further details.    FIndings: 1.  Irritated appendix  2. No peri-appendiceal abscess or phlegmon 3. Normal anatomy 4. Appendiceal artery ligated and divided with stapler 5. Adequate hemostasis.   Description of procedure: The patient was placed on the operating table in the supine position, left arm tucked. General anesthesia was induced. A time-out was completed verifying correct patient, procedure, site, positioning, and implant(s) and/or special equipment prior to beginning this procedure. The abdomen was prepped and draped in the usual sterile fashion.   Palmer's point located and Veress needle was inserted.  After confirming 2 clicks and a positive saline drop test, gas insufflation was initiated until the abdominal pressure was measured at 15 mmHg.  Afterwards, the Veress needle was removed and a 8 mm port was placed through a periumbilical site using Optiview technique after incision with an 11 blade.  After local was infused, 2 additional incision was made 8 cm apart each side along the left side of the abdominal wall from the initial incision.  An 8 mm port caudad and 12mm port cephalad from initial incision, both under direct visualization.  No injuries from trocar placements were noted. The table was placed in the Trendelenburg position with the right side elevated.  Xi robotic platform was then brought to the operative field and docked.  An inflamed appendix was identified and elevated.  Infection was present within the abdominal cavity due to appendicitis. Window created at base of appendix in the mesentery.    A blue load linear cutting stapler was then used to divide and staple the base of the appendix. It was reloaded with a vascular cartridge and the mesoappendix similarly divided. Bleeding portion controlled with cautery.  The appendix was removed through 12mm port.  The appendiceal stump and mesoappendix staple line examined again and hemostasis noted. Bloody discharge consistent with ruptured ovarian cyst noted in pelvis.  Fluid all suctioned out. The 12 mm trocar removed and port site closed with PMI using 0 vicryl under direct vision. Remaining trocars were removed under direct vision. No bleeding was noted.The abdomen was allowed to collapse. All skin incisions then closed with subcuticular sutures Monocryl 4-0.  Wounds then dressed with dermabond.  The patient tolerated the procedure well, awakened from anesthesia and was taken to the postanesthesia care unit in satisfactory condition.  Sponge count and instrument count correct at the end of the procedure.

## 2023-09-18 NOTE — Transfer of Care (Signed)
 Immediate Anesthesia Transfer of Care Note  Patient: Kristin Acosta  Procedure(s) Performed: APPENDECTOMY, ROBOT-ASSISTED, LAPAROSCOPIC  Patient Location: PACU  Anesthesia Type:General  Level of Consciousness: awake, alert , and oriented  Airway & Oxygen Therapy: Patient Spontanous Breathing  Post-op Assessment: Report given to RN and Post -op Vital signs reviewed and stable  Post vital signs: Reviewed and stable  Last Vitals:  Vitals Value Taken Time  BP    Temp 36.1 C 09/18/23 13:30  Pulse    Resp    SpO2      Last Pain:  Vitals:   09/18/23 0730  TempSrc:   PainSc: Asleep         Complications: No notable events documented.

## 2023-09-18 NOTE — Anesthesia Procedure Notes (Signed)
 Procedure Name: Intubation Date/Time: 09/18/2023 12:34 PM  Performed by: Erie Jyl MATSU, CRNAPre-anesthesia Checklist: Patient identified, Patient being monitored, Timeout performed, Emergency Drugs available and Suction available Patient Re-evaluated:Patient Re-evaluated prior to induction Oxygen Delivery Method: Circle system utilized Preoxygenation: Pre-oxygenation with 100% oxygen Induction Type: IV induction Ventilation: Mask ventilation without difficulty Laryngoscope Size: Mac and 3 Grade View: Grade I Tube type: Oral Tube size: 6.5 mm Number of attempts: 1 Airway Equipment and Method: Stylet Placement Confirmation: ETT inserted through vocal cords under direct vision, positive ETCO2 and breath sounds checked- equal and bilateral Secured at: 19 cm Tube secured with: Tape Dental Injury: Teeth and Oropharynx as per pre-operative assessment

## 2023-09-19 ENCOUNTER — Encounter: Payer: Self-pay | Admitting: Surgery

## 2023-09-19 LAB — BASIC METABOLIC PANEL WITH GFR
Anion gap: 12 (ref 5–15)
BUN: 9 mg/dL (ref 6–20)
CO2: 22 mmol/L (ref 22–32)
Calcium: 8.5 mg/dL — ABNORMAL LOW (ref 8.9–10.3)
Chloride: 106 mmol/L (ref 98–111)
Creatinine, Ser: 0.65 mg/dL (ref 0.44–1.00)
GFR, Estimated: >60 mL/min (ref >60)
Glucose, Bld: 108 mg/dL — ABNORMAL HIGH (ref 70–99)
Potassium: 3.8 mmol/L (ref 3.5–5.1)
Sodium: 140 mmol/L (ref 135–145)

## 2023-09-19 LAB — CBC
HCT: 35.4 % — ABNORMAL LOW (ref 36.0–46.0)
Hemoglobin: 11.7 g/dL — ABNORMAL LOW (ref 12.0–15.0)
MCH: 28.8 pg (ref 26.0–34.0)
MCHC: 33.1 g/dL (ref 30.0–36.0)
MCV: 87.2 fL (ref 80.0–100.0)
Platelets: 222 K/uL (ref 150–400)
RBC: 4.06 MIL/uL (ref 3.87–5.11)
RDW: 13.2 % (ref 11.5–15.5)
WBC: 10.2 K/uL (ref 4.0–10.5)
nRBC: 0 % (ref 0.0–0.2)

## 2023-09-19 NOTE — TOC CM/SW Note (Signed)
 Transition of Care [facility]) - Inpatient Brief Assessment   Patient Details  Name: Kristin Acosta MRN: [id number] Date of Birth: 2000/06/24  Transition of Care [facility] CM/SW Contact:    Asberry CHRISTELLA Jaksch, RN Phone Number: 09/19/2023, 9:00 AM   Clinical Narrative:  Transition of Care [facility]) Screening Note   Patient Details  Name: [name] Date of Birth: Sep 29, 2000   Transition of Care [facility]) CM/SW Contact:    Asberry CHRISTELLA Jaksch, RN Phone Number: 09/19/2023, 9:00 AM    Transition of Care Department [facility]) has reviewed patient and no TOC needs have been identified at this time. If new patient transition needs arise, please place a TOC consult.   Patient noted to have no insurance. Resources for sliding scale and open door clinics added to AVS.   Transition of Care Asessment: Insurance and Status: Selfpay Patient has primary care physician: Yes     Prior/Current Home Services: No current home services Social Drivers of Health Review: SDOH reviewed no interventions necessary Readmission risk has been reviewed: Yes Transition of care needs: no transition of care needs at this time

## 2023-09-19 NOTE — Discharge Summary (Signed)
 [facility]-General Surgery  SURGICAL DISCHARGE SUMMARY  Patient ID: Kristin Acosta MRN: [id number] DOB/AGE: 03-02-2000 23 y.o.  Admit date: 09/18/2023 Discharge date: 09/19/2023  Discharge Diagnoses Patient Active Problem List   Diagnosis Date Noted   Acute appendicitis 09/18/2023    Consultants None   Procedures Robotic assisted laparoscopic appendectomy    Hospital Course:  Patient presented to the [facility] ED on 09/18/2023 with periumbilical abdominal pain associated with nausea and vomiting.  In the ED, labs indicated leukocytosis with a WBC of 16.4 and normal Hgb of 13.1.  Low potassium levels of 3.1, otherwise normal lab values.  Lipase normal at 36.  CT of abdomen showed slightly enlarged appendix, mostly fluid-filled, findings consistent with acute appendicitis. Patient was taken to the operating room later that afternoon for robotic assisted laparoscopic appendectomy. Surgery went well. Patient tolerated procedure.  Leukocytosis has resolved. Patient is tolerating diet post-op and is able to ambulate. Surgical incisions show no signs of infection. Pain is well-controlled. Patient is clear from surgical standpoint.  Patient will follow-up outpatient with Dr. Tye in 2 weeks.     Physical Examination:  Constitutional: alert, in no acute distress Pulmonary: CTA bilaterally, normal breath sounds Cardiac: regular rate and rhythm Gastrointestinal: soft, mildly tender, and non-distended Skin: abdominal incisions look clean, dry, and intact with surgical glue in place    Allergies as of 09/19/2023       Reactions   Penicillins Rash        Medication List     TAKE these medications    acetaminophen  325 MG tablet Commonly known as: Tylenol  Take 2 tablets (650 mg total) by mouth every 8 (eight) hours as needed for mild pain (pain score 1-3). What changed:  medication strength how much to take when to take this reasons to take this   docusate sodium  100 MG  capsule Commonly known as: Colace Take 1 capsule (100 mg total) by mouth 2 (two) times daily as needed for up to 10 days for mild constipation.   ibuprofen  800 MG tablet Commonly known as: ADVIL  Take 1 tablet (800 mg total) by mouth every 8 (eight) hours as needed for mild pain (pain score 1-3) or moderate pain (pain score 4-6).   traMADol  50 MG tablet Commonly known as: Ultram  Take 1 tablet (50 mg total) by mouth every 8 (eight) hours as needed.          Follow-up Information     Tye, Isami, DO Follow up in 2 week(s).   Specialties: General Surgery, Surgery Why: post op appy Contact information: [address] [name] [phone]                  Time spent on discharge management including discussion of hospital course, clinical condition, outpatient instructions, prescriptions, and follow up with the patient and members of the medical team: >30 minutes  Aaima Gaddie Barrientos PA-C

## 2023-09-20 LAB — SURGICAL PATHOLOGY
